# Patient Record
Sex: Female | Born: 1945 | Race: White | Hispanic: No
Health system: Southern US, Community
[De-identification: ages and names within clinical notes are randomized; demographics above are authoritative.]

---

## 2021-06-02 ENCOUNTER — Other Ambulatory Visit: Payer: Self-pay | Admitting: Internal Medicine

## 2021-06-02 DIAGNOSIS — Z1231 Encounter for screening mammogram for malignant neoplasm of breast: Secondary | ICD-10-CM

## 2021-07-06 ENCOUNTER — Other Ambulatory Visit: Payer: Self-pay

## 2021-07-06 ENCOUNTER — Ambulatory Visit
Admission: RE | Admit: 2021-07-06 | Discharge: 2021-07-06 | Disposition: A | Discharge status: Home / Self Care | Payer: Medicare Other | Source: Ambulatory Visit | Attending: Internal Medicine | Admitting: Internal Medicine

## 2021-07-06 DIAGNOSIS — Z1231 Encounter for screening mammogram for malignant neoplasm of breast: Secondary | ICD-10-CM

## 2021-08-24 ENCOUNTER — Ambulatory Visit: Payer: Medicare Other | Admitting: Podiatry

## 2021-09-09 ENCOUNTER — Other Ambulatory Visit: Payer: Self-pay

## 2021-09-09 ENCOUNTER — Encounter: Payer: Self-pay | Admitting: Podiatry

## 2021-09-09 ENCOUNTER — Ambulatory Visit (INDEPENDENT_AMBULATORY_CARE_PROVIDER_SITE_OTHER): Payer: Medicare Other | Admitting: Podiatry

## 2021-09-09 DIAGNOSIS — L600 Ingrowing nail: Secondary | ICD-10-CM | POA: Diagnosis not present

## 2021-09-09 DIAGNOSIS — B351 Tinea unguium: Secondary | ICD-10-CM

## 2021-09-09 NOTE — Progress Notes (Signed)
Subjective:   Patient ID: Mckenzie Garcia, female   DOB: 76 y.o.   MRN: [id number]   HPI Patient presents with chronic ingrown toenail deformity left hallux that is been sore and makes it hard to wear shoe gear and states she is tried to trim it and its been present for several years off and on.  As other nails are thickened as she has questions and concerns conservative that.  Patient does not smoke likes to be active   Review of Systems  All other systems reviewed and are negative.      Objective:  Physical Exam Vitals and nursing note reviewed.  Constitutional:      Appearance: She is well-developed.  Pulmonary:     Effort: Pulmonary effort is normal.  Musculoskeletal:        General: Normal range of motion.  Skin:    General: Skin is warm.  Neurological:     Mental Status: She is alert.    Neurovascular status intact muscle strength found to be adequate range of motion adequate.  Patient is noted to have incurvated medial border left hallux painful when pressed with inability to wear shoe gear comfortably.  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  Today I infiltrated the left hallux 60 mg like Marcaine mixture sterile prep done and using sterile instrumentation to I went ahead and remove the lateral border exposed matrix applied phenol 3 applications 30 seconds followed by alcohol lavage sterile dressing gave instructions on soaks and to leave dressing on 24 hours but take it off earlier if any throbbing were to occur and encouraged her to call with questions concerns.  I reviewed adjacent nails do not recommend  treatment discussed fungus versus trauma

## 2021-09-09 NOTE — Patient Instructions (Signed)

## 2021-09-23 ENCOUNTER — Telehealth: Payer: Self-pay | Admitting: *Deleted

## 2021-09-23 NOTE — Telephone Encounter (Signed)
Patient is calling because her toe may be infected,has been 2 weeks since procedure and now her toe is red,blistered. Please advise.

## 2021-09-23 NOTE — Telephone Encounter (Signed)
Stop epson salt soaks and go to warm soapy water. If persist or drainage increases we can start antibiotic

## 2021-09-24 NOTE — Telephone Encounter (Signed)
Called patient giving recommendations per Dr Paulla Dolly, verbalized understanding and said that she has started that already.

## 2022-04-29 IMAGING — MG MM DIGITAL SCREENING BILAT W/ TOMO AND CAD
8 series · 9 of 24 positions shown · non-contrast
Comparison: Previous exam(s).

CLINICAL DATA: Screening.

EXAM:
DIGITAL SCREENING BILATERAL MAMMOGRAM WITH TOMOSYNTHESIS AND CAD
TECHNIQUE: Bilateral screening digital craniocaudal and mediolateral oblique
mammograms were obtained. Bilateral screening digital breast
tomosynthesis was performed. The images were evaluated with
computer-aided detection.

[R CC synth-2D]
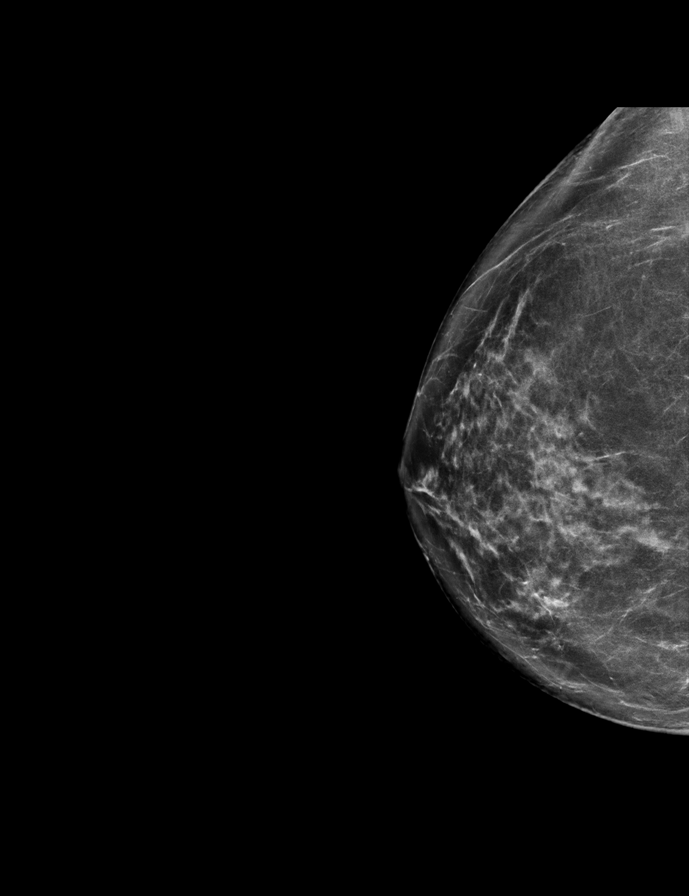

[R MLO synth-2D]
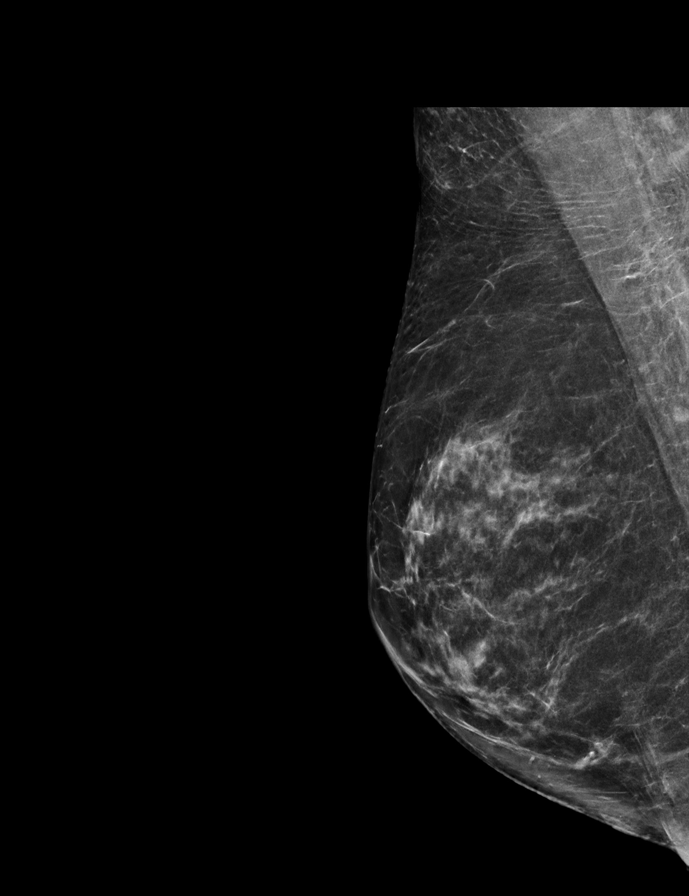

[L CC synth-2D]
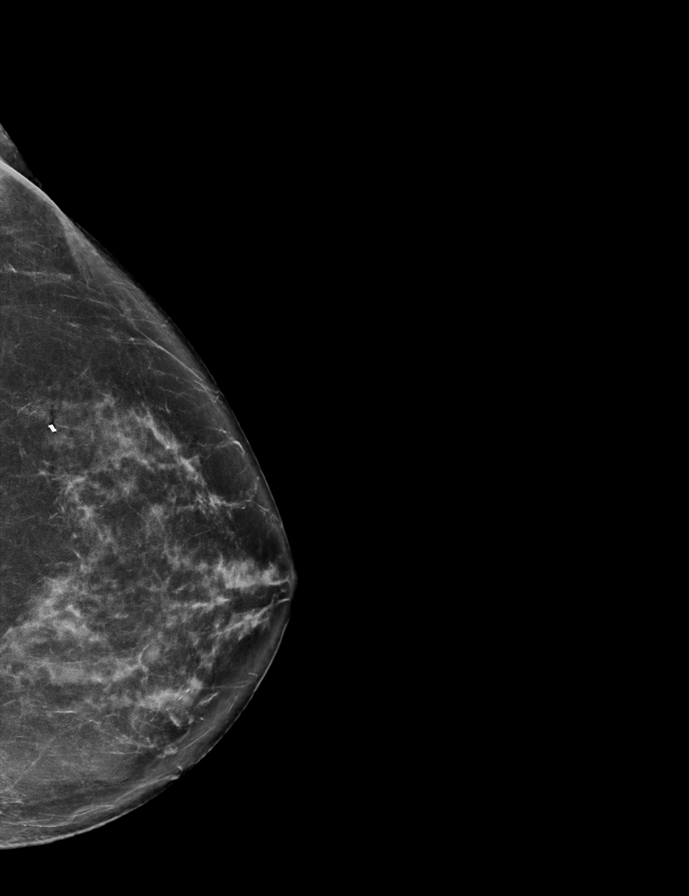

[L MLO synth-2D]
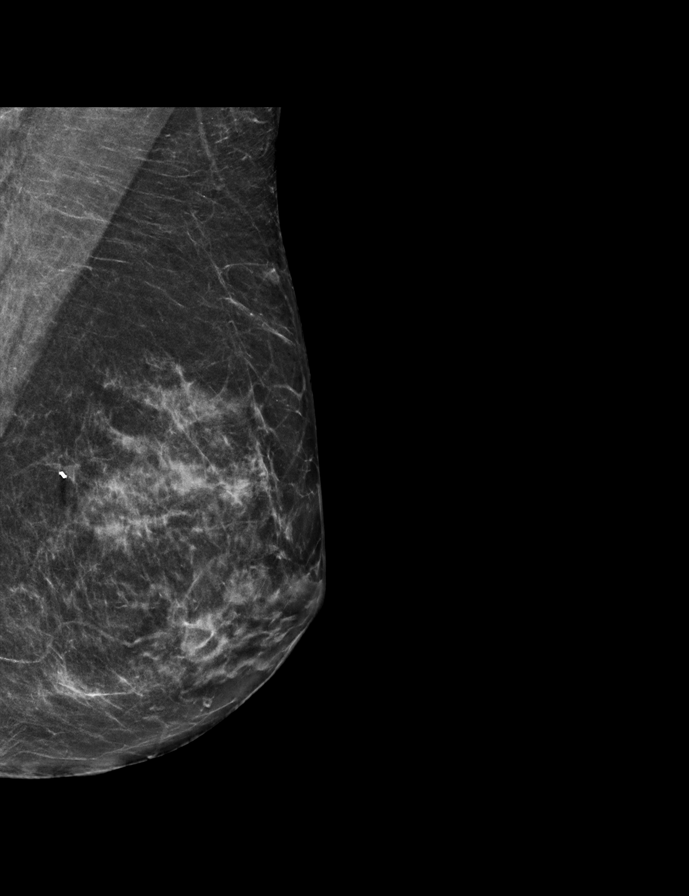

[L CC tomo · 2 of 67 frames shown]
[frame 22/67]
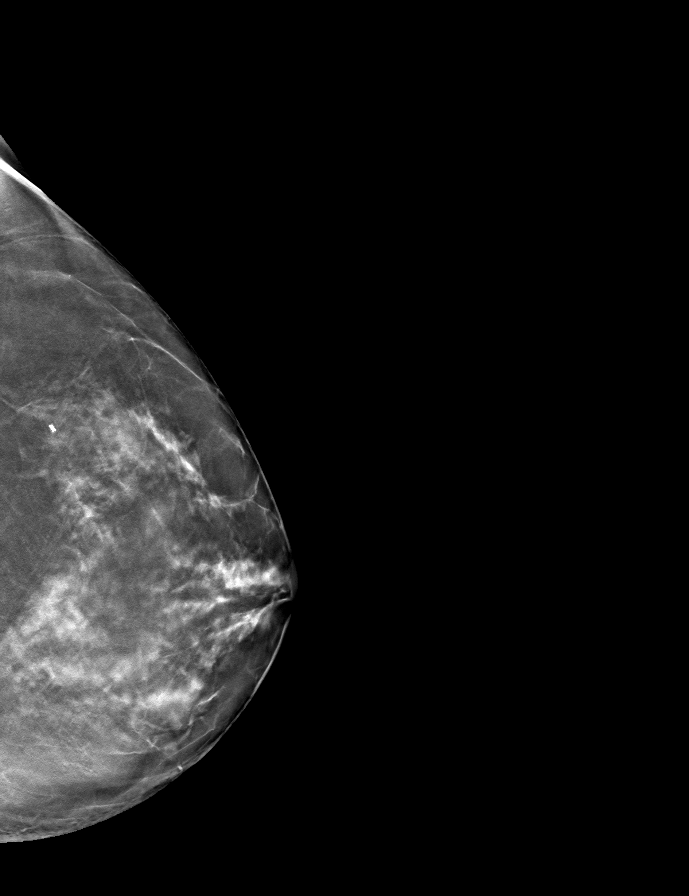
[frame 34/67]
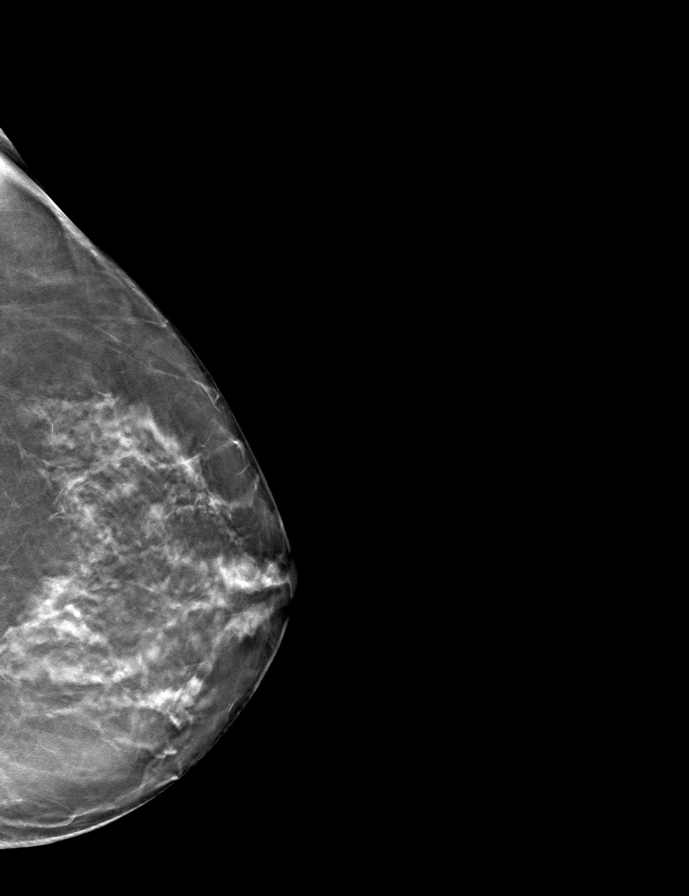

[R MLO tomo · tomo slice 33/65.0]
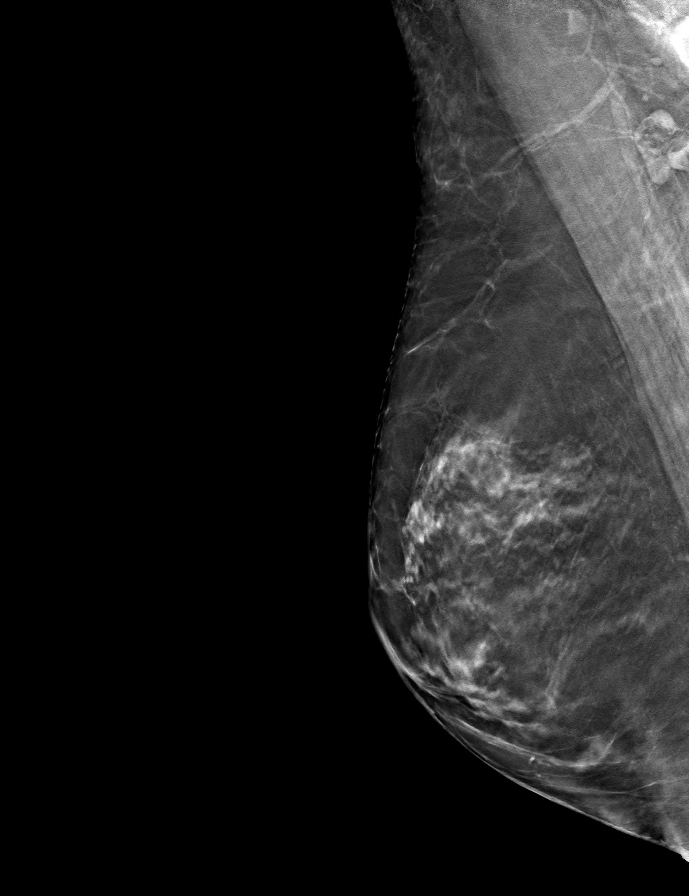

[L MLO tomo · tomo slice 31/61.0]
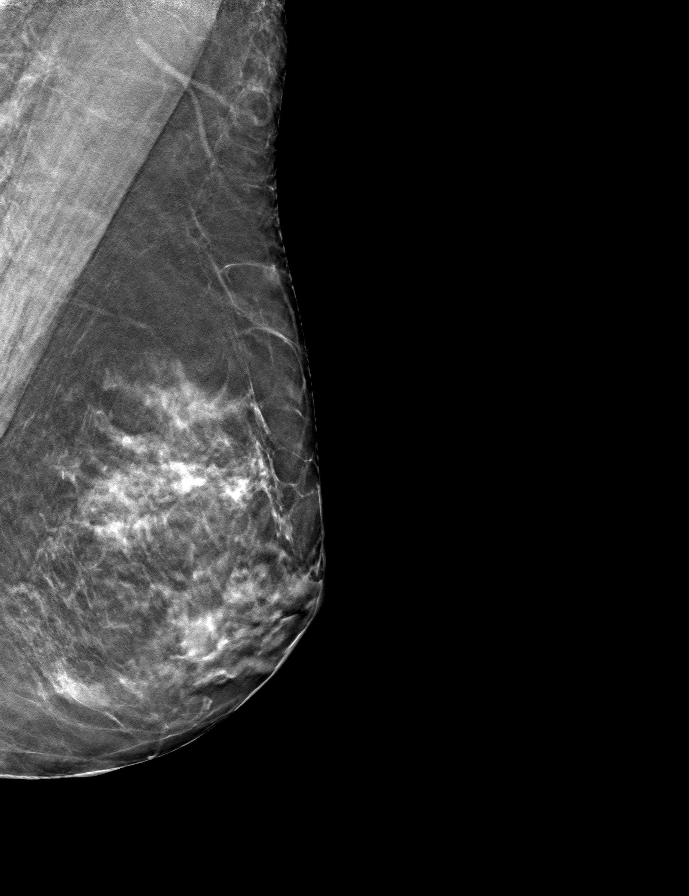

[R CC tomo · tomo slice 37/72.0]
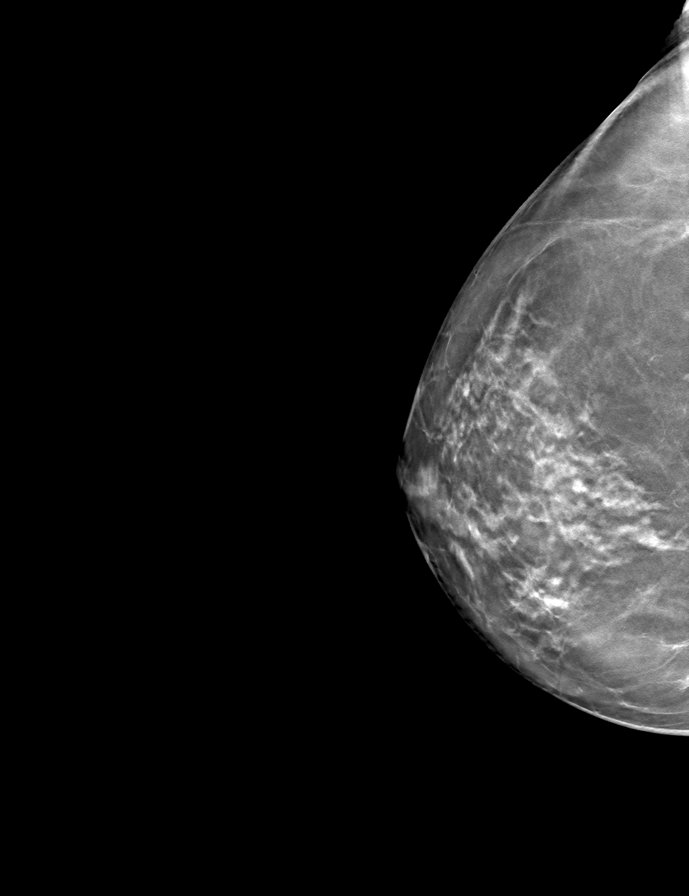

[9 of 24 positions shown; findings below may reference images not displayed]

ACR Breast Density Category c: The breast tissue is heterogeneously
dense, which may obscure small masses.
FINDINGS: There are no findings suspicious for malignancy.
IMPRESSION: No mammographic evidence of malignancy. A result letter of this
screening mammogram will be mailed directly to the patient.

RECOMMENDATION:
Screening mammogram in one year. (Code:Q3-W-BC3)

BI-RADS CATEGORY  1: Negative.

## 2022-05-27 ENCOUNTER — Other Ambulatory Visit: Payer: Self-pay | Admitting: Internal Medicine

## 2022-05-27 DIAGNOSIS — Z1231 Encounter for screening mammogram for malignant neoplasm of breast: Secondary | ICD-10-CM

## 2022-07-12 ENCOUNTER — Ambulatory Visit
Admission: RE | Admit: 2022-07-12 | Discharge: 2022-07-12 | Disposition: A | Discharge status: Home / Self Care | Payer: Medicare Other | Source: Ambulatory Visit | Attending: Internal Medicine | Admitting: Internal Medicine

## 2022-07-12 DIAGNOSIS — Z1231 Encounter for screening mammogram for malignant neoplasm of breast: Secondary | ICD-10-CM

## 2022-08-19 ENCOUNTER — Other Ambulatory Visit: Payer: Self-pay | Admitting: Internal Medicine

## 2022-08-19 ENCOUNTER — Ambulatory Visit
Admission: RE | Admit: 2022-08-19 | Discharge: 2022-08-19 | Disposition: A | Discharge status: Home / Self Care | Payer: Medicare Other | Source: Ambulatory Visit | Attending: Internal Medicine | Admitting: Internal Medicine

## 2022-08-19 DIAGNOSIS — R051 Acute cough: Secondary | ICD-10-CM

## 2022-09-09 ENCOUNTER — Ambulatory Visit
Admission: RE | Admit: 2022-09-09 | Discharge: 2022-09-09 | Disposition: A | Discharge status: Home / Self Care | Payer: Medicare Other | Source: Ambulatory Visit | Attending: Internal Medicine | Admitting: Internal Medicine

## 2022-09-09 ENCOUNTER — Other Ambulatory Visit: Payer: Self-pay | Admitting: Internal Medicine

## 2022-09-09 DIAGNOSIS — R051 Acute cough: Secondary | ICD-10-CM

## 2023-06-06 ENCOUNTER — Other Ambulatory Visit: Payer: Self-pay | Admitting: Internal Medicine

## 2023-06-06 DIAGNOSIS — Z1231 Encounter for screening mammogram for malignant neoplasm of breast: Secondary | ICD-10-CM

## 2023-08-30 ENCOUNTER — Ambulatory Visit
Admission: RE | Admit: 2023-08-30 | Discharge: 2023-08-30 | Disposition: A | Discharge status: Home / Self Care | Payer: Medicare Other | Source: Ambulatory Visit | Attending: Internal Medicine | Admitting: Internal Medicine

## 2023-08-30 DIAGNOSIS — Z1231 Encounter for screening mammogram for malignant neoplasm of breast: Secondary | ICD-10-CM

## 2023-10-22 ENCOUNTER — Encounter (HOSPITAL_COMMUNITY): Payer: Self-pay | Admitting: Emergency Medicine

## 2023-10-22 ENCOUNTER — Other Ambulatory Visit: Payer: Self-pay

## 2023-10-22 ENCOUNTER — Emergency Department (HOSPITAL_COMMUNITY)
Admission: EM | Admit: 2023-10-22 | Discharge: 2023-10-22 | Disposition: A | Discharge status: Home / Self Care | Attending: Emergency Medicine | Admitting: Emergency Medicine

## 2023-10-22 DIAGNOSIS — R110 Nausea: Secondary | ICD-10-CM | POA: Insufficient documentation

## 2023-10-22 DIAGNOSIS — R197 Diarrhea, unspecified: Secondary | ICD-10-CM | POA: Diagnosis present

## 2023-10-22 LAB — URINALYSIS, ROUTINE W REFLEX MICROSCOPIC
Bilirubin Urine: NEGATIVE
Glucose, UA: NEGATIVE mg/dL
Hgb urine dipstick: NEGATIVE
Ketones, ur: NEGATIVE mg/dL
Leukocytes,Ua: NEGATIVE
Nitrite: NEGATIVE
Protein, ur: NEGATIVE mg/dL
Specific Gravity, Urine: >1.03 — ABNORMAL HIGH (ref 1.005–1.030)
pH: 5.5 (ref 5.0–8.0)

## 2023-10-22 LAB — COMPREHENSIVE METABOLIC PANEL
ALT: 20 U/L (ref 0–44)
AST: 21 U/L (ref 15–41)
Albumin: 3.9 g/dL (ref 3.5–5.0)
Alkaline Phosphatase: 59 U/L (ref 38–126)
Anion gap: 12 (ref 5–15)
BUN: 17 mg/dL (ref 8–23)
CO2: 22 mmol/L (ref 22–32)
Calcium: 9.3 mg/dL (ref 8.9–10.3)
Chloride: 105 mmol/L (ref 98–111)
Creatinine, Ser: 0.77 mg/dL (ref 0.44–1.00)
GFR, Estimated: >60 mL/min (ref >60)
Glucose, Bld: 112 mg/dL — ABNORMAL HIGH (ref 70–99)
Potassium: 3.2 mmol/L — ABNORMAL LOW (ref 3.5–5.1)
Sodium: 139 mmol/L (ref 135–145)
Total Bilirubin: 0.4 mg/dL (ref 0.0–1.2)
Total Protein: 7 g/dL (ref 6.5–8.1)

## 2023-10-22 LAB — CBC
HCT: 42.3 % (ref 36.0–46.0)
Hemoglobin: 14 g/dL (ref 12.0–15.0)
MCH: 31.5 pg (ref 26.0–34.0)
MCHC: 33.1 g/dL (ref 30.0–36.0)
MCV: 95.3 fL (ref 80.0–100.0)
Platelets: 327 10*3/uL (ref 150–400)
RBC: 4.44 MIL/uL (ref 3.87–5.11)
RDW: 12.8 % (ref 11.5–15.5)
WBC: 11.6 10*3/uL — ABNORMAL HIGH (ref 4.0–10.5)
nRBC: 0 % (ref 0.0–0.2)

## 2023-10-22 LAB — C DIFFICILE QUICK SCREEN W PCR REFLEX
C Diff antigen: NEGATIVE
C Diff interpretation: NOT DETECTED
C Diff toxin: NEGATIVE

## 2023-10-22 LAB — LIPASE, BLOOD: Lipase: 57 U/L — ABNORMAL HIGH (ref 11–51)

## 2023-10-22 MED ORDER — ONDANSETRON 4 MG PO TBDP
4.0000 mg | ORAL_TABLET | Freq: Once | ORAL | Status: AC
  Administered 2023-10-22: 4 mg via ORAL
  Filled 2023-10-22: qty 1

## 2023-10-22 NOTE — ED Triage Notes (Signed)
 Pt in with diarrhea, pt states that her husband is currently admitted upstairs with possible Norovirus. Pt states she had one episode of explosive diarrhea, no vomiting or abdominal pain. Hx of cdiff, no recent abx

## 2023-10-22 NOTE — ED Notes (Signed)
 Pt stated she vomited when going to the restroom. She is requesting nausea medication before d/c. EDP notified

## 2023-10-22 NOTE — Discharge Instructions (Addendum)
 Your workup today was reassuring.  Please follow-up with your primary care provider as needed for further evaluation. You may take over the counter Imodium for symptom management. If you develop any life threatening symptoms please return to the emergency department.

## 2023-10-22 NOTE — ED Provider Notes (Signed)
 [place] EMERGENCY DEPARTMENT AT [facility] Provider Note   CSN: [id number] Arrival date & time: 10/22/23  0230     History  Chief Complaint  Patient presents with   Diarrhea    Karene Bracken is a 78 y.o. female.  Patient with past medical history significant for C. difficile infection presents the emergency department complaining of 3 episodes of diarrhea.  She states that her husband is currently admitted to the hospital with GI panel pending concerning for possible norovirus.  She does endorse mild nausea but denies vomiting, abdominal pain, chest pain, shortness of breath, fever.  She denies any recent antibiotic use.  All episodes of diarrhea have occurred in the past 3 hours   Diarrhea      Home Medications Prior to Admission medications   Medication Sig Start Date End Date Taking? Authorizing Provider  acetaminophen (TYLENOL) 325 MG tablet 1 tablet as needed    [provider]  ALPRAZolam (XANAX) 0.25 MG tablet TAKE 1 TABLET BY MOUTH EVERY DAY AS NEEDED FOR 30 DAYS 07/24/15   [provider]  levocetirizine (XYZAL) 5 MG tablet Take by mouth.    [provider]  valACYclovir (VALTREX) 1000 MG tablet 1 tablet twice a day at first sign of outbreak    [provider]      Allergies    Patient has no allergy information on record.    Review of Systems   Review of Systems  Gastrointestinal:  Positive for diarrhea.    Physical Exam Updated Vital Signs BP 120/76 (BP Location: Right Arm)   Pulse (!) 106   Temp 97.6 F (36.4 C)   Resp 17   Wt 63.5 kg   SpO2 100%  Physical Exam Vitals and nursing note reviewed.  Constitutional:      General: She is not in acute distress.    Appearance: She is well-developed.  HENT:     Head: Normocephalic and atraumatic.  Eyes:     Conjunctiva/sclera: Conjunctivae normal.  Cardiovascular:     Rate and Rhythm: Normal rate.     Heart sounds: No murmur heard. Pulmonary:      Effort: Pulmonary effort is normal. No respiratory distress.  Abdominal:     Palpations: Abdomen is soft.     Tenderness: There is no abdominal tenderness.  Musculoskeletal:        General: No swelling.     Cervical back: Neck supple.  Skin:    General: Skin is warm and dry.     Capillary Refill: Capillary refill takes less than 2 seconds.  Neurological:     Mental Status: She is alert.  Psychiatric:        Mood and Affect: Mood normal.     ED Results / Procedures / Treatments   Labs (all labs ordered are listed, but only abnormal results are displayed) Labs Reviewed  LIPASE, BLOOD - Abnormal; Notable for the following components:      Result Value   Lipase 57 (*)    All other components within normal limits  COMPREHENSIVE METABOLIC PANEL - Abnormal; Notable for the following components:   Potassium 3.2 (*)    Glucose, Bld 112 (*)    All other components within normal limits  CBC - Abnormal; Notable for the following components:   WBC 11.6 (*)    All other components within normal limits  URINALYSIS, ROUTINE W REFLEX MICROSCOPIC - Abnormal; Notable for the following components:   Specific Gravity, Urine >1.030 (*)  All other components within normal limits  C DIFFICILE QUICK SCREEN W PCR REFLEX    GASTROINTESTINAL PANEL BY PCR, STOOL (REPLACES STOOL CULTURE)    EKG None  Radiology No results found.  Procedures Procedures    Medications Ordered in ED Medications - No data to display  ED Course/ Medical Decision Making/ A&P                                 Medical Decision Making Amount and/or Complexity of Data Reviewed Labs: ordered.   This patient presents to the ED for concern of diarrhea, this involves an extensive number of treatment options, and is a complaint that carries with it a high risk of complications and morbidity.  The differential diagnosis includes norovirus, C. difficile, other infection, functional diarrhea, others   Co morbidities  that complicate the patient evaluation  History of C. difficile infection   Lab Tests:  I Ordered, and personally interpreted labs.  The pertinent results include: WBC 11,600   Imaging Studies ordered:  Patient has no abdominal tenderness on exam.  No indication at this time for abdominal imaging   Test / Admission - Considered:  I ordered a GI panel and C. difficile panel for the patient but the patient was unable to provide a sample.  She was monitored for 2 hours with no subsequent diarrhea.  At this time plan to discharge patient with recommendations for symptom management with Imodium at home.  Patient may follow-up with her primary care provider for further evaluation as needed.         Final Clinical Impression(s) / ED Diagnoses Final diagnoses:  Diarrhea of presumed infectious origin    Rx / DC Orders ED Discharge Orders     None         Pamala Duffel 10/22/23 [id number]    Nira Conn, MD 10/22/23 [phone]

## 2023-10-23 LAB — GASTROINTESTINAL PANEL BY PCR, STOOL (REPLACES STOOL CULTURE)

## 2024-03-21 ENCOUNTER — Encounter: Payer: Self-pay | Admitting: Podiatry

## 2024-03-21 ENCOUNTER — Ambulatory Visit: Admitting: Podiatry

## 2024-03-21 VITALS — Ht 63.0 in | Wt 140.0 lb

## 2024-03-21 DIAGNOSIS — L600 Ingrowing nail: Secondary | ICD-10-CM

## 2024-03-21 NOTE — Patient Instructions (Signed)

## 2024-03-23 NOTE — Progress Notes (Signed)
 Subjective:   Patient ID: Mckenzie  Garcia, female   DOB: 78 y.o.   MRN: [id number]   HPI Patient has a lot of discomfort with ingrown toenail left big toe lateral border that is painful when wearing shoes and patient has been trying to soak it and trim it to the best of her ability   ROS      Objective:  Physical Exam  Neurovascular status intact with patient found to have good digital perfusion well-oriented with an incurvated lateral border left big toe painful when pressed with slight redness no active drainage and     Assessment:  Grow toenail deformity left hallux lateral border with pain     Plan:  H&P reviewed recommended correction explained procedure risk patient wants surgery and I allowed her to read consent form for correction going over the procedure and risk.  I infiltrated the left big toe 60 mg Xylocaine Marcaine mixture sterile prep done and using sterile instrumentation remove the lateral border exposed matrix and applied phenol 3 applications 30 seconds followed by alcohol lavage sterile dressing gave instructions on soaks wear dressing 24 hours take it off earlier if throbbing were to occur and encouraged her to call with questions concerns which may arise

## 2024-08-13 ENCOUNTER — Other Ambulatory Visit: Payer: Self-pay | Admitting: Internal Medicine

## 2024-08-13 DIAGNOSIS — Z1231 Encounter for screening mammogram for malignant neoplasm of breast: Secondary | ICD-10-CM

## 2024-10-18 ENCOUNTER — Ambulatory Visit
